# Patient Record
Sex: Male | Born: 2000 | Race: Black or African American | Hispanic: No | Marital: Single
Health system: Southern US, Community
[De-identification: ages and names within clinical notes are randomized; demographics above are authoritative.]

## PROBLEM LIST (undated history)

## (undated) DIAGNOSIS — J45909 Unspecified asthma, uncomplicated: Secondary | ICD-10-CM

## (undated) HISTORY — PX: TONSILLECTOMY: SUR1361

---
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
